# Patient Record
Sex: Male | Born: 2005
Health system: Southern US, Community
[De-identification: ages and names within clinical notes are randomized; demographics above are authoritative.]

---
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
## 2024-09-25 ENCOUNTER — Telehealth: Payer: Self-pay

## 2024-09-25 NOTE — Telephone Encounter (Signed)
 Left VM to let pt know NP visit needs to be rescheduled from 2.11.26  E2C2, please help pt reschedule with Nancyann Huh, NP if they call back

## 2024-09-30 ENCOUNTER — Ambulatory Visit: Payer: PRIVATE HEALTH INSURANCE
